# Patient Record
Sex: Male | Born: 1970 | Race: White | Hispanic: No | Marital: Married | Smoking: Never smoker
Health system: Southern US, Community
[De-identification: ages and names within clinical notes are randomized; demographics above are authoritative.]

---

## 2000-10-19 ENCOUNTER — Emergency Department (HOSPITAL_COMMUNITY): Admission: EM | Admit: 2000-10-19 | Discharge: 2000-10-19 | Payer: Self-pay | Admitting: Emergency Medicine

## 2006-06-17 ENCOUNTER — Encounter: Admission: RE | Admit: 2006-06-17 | Discharge: 2006-06-17 | Payer: Self-pay | Admitting: Otolaryngology

## 2006-11-08 ENCOUNTER — Encounter: Admission: RE | Admit: 2006-11-08 | Discharge: 2006-11-08 | Payer: Self-pay | Admitting: Internal Medicine

## 2007-10-13 ENCOUNTER — Encounter: Admission: RE | Admit: 2007-10-13 | Discharge: 2007-10-13 | Payer: Self-pay | Admitting: Internal Medicine

## 2008-02-23 ENCOUNTER — Encounter: Admission: RE | Admit: 2008-02-23 | Discharge: 2008-02-23 | Payer: Self-pay | Admitting: Gastroenterology

## 2008-03-28 ENCOUNTER — Ambulatory Visit: Payer: Self-pay | Admitting: Vascular Surgery

## 2008-03-28 ENCOUNTER — Ambulatory Visit (HOSPITAL_COMMUNITY): Admission: RE | Admit: 2008-03-28 | Discharge: 2008-03-28 | Payer: Self-pay | Admitting: Hematology & Oncology

## 2008-07-16 ENCOUNTER — Encounter: Admission: RE | Admit: 2008-07-16 | Discharge: 2008-07-16 | Payer: Self-pay | Admitting: Internal Medicine

## 2008-08-09 ENCOUNTER — Encounter: Admission: RE | Admit: 2008-08-09 | Discharge: 2008-08-09 | Payer: Self-pay | Admitting: Internal Medicine

## 2012-11-20 ENCOUNTER — Other Ambulatory Visit: Payer: Self-pay | Admitting: Internal Medicine

## 2012-11-20 DIAGNOSIS — N50819 Testicular pain, unspecified: Secondary | ICD-10-CM

## 2012-11-23 ENCOUNTER — Ambulatory Visit
Admission: RE | Admit: 2012-11-23 | Discharge: 2012-11-23 | Disposition: A | Discharge status: Home / Self Care | Payer: BC Managed Care – PPO | Source: Ambulatory Visit | Attending: Internal Medicine | Admitting: Internal Medicine

## 2012-11-23 DIAGNOSIS — N50819 Testicular pain, unspecified: Secondary | ICD-10-CM

## 2013-10-16 ENCOUNTER — Other Ambulatory Visit: Payer: Self-pay | Admitting: Internal Medicine

## 2013-10-16 DIAGNOSIS — R209 Unspecified disturbances of skin sensation: Secondary | ICD-10-CM

## 2013-10-19 ENCOUNTER — Ambulatory Visit
Admission: RE | Admit: 2013-10-19 | Discharge: 2013-10-19 | Disposition: A | Discharge status: Home / Self Care | Payer: BC Managed Care – PPO | Source: Ambulatory Visit | Attending: Internal Medicine | Admitting: Internal Medicine

## 2013-10-19 DIAGNOSIS — R209 Unspecified disturbances of skin sensation: Secondary | ICD-10-CM

## 2013-10-19 MED ORDER — GADOBENATE DIMEGLUMINE 529 MG/ML IV SOLN
16.0000 mL | Freq: Once | INTRAVENOUS | Status: AC | PRN
  Administered 2013-10-19: 16 mL via INTRAVENOUS

## 2013-11-02 ENCOUNTER — Encounter (HOSPITAL_COMMUNITY): Payer: Self-pay | Admitting: Emergency Medicine

## 2013-11-02 ENCOUNTER — Observation Stay (HOSPITAL_COMMUNITY): Payer: BC Managed Care – PPO | Admitting: Anesthesiology

## 2013-11-02 ENCOUNTER — Emergency Department (HOSPITAL_COMMUNITY): Payer: BC Managed Care – PPO

## 2013-11-02 ENCOUNTER — Observation Stay (HOSPITAL_COMMUNITY)
Admission: EM | Admit: 2013-11-02 | Discharge: 2013-11-04 | Disposition: A | Discharge status: Home / Self Care | Payer: BC Managed Care – PPO | Attending: Surgery | Admitting: Surgery

## 2013-11-02 ENCOUNTER — Encounter (HOSPITAL_COMMUNITY)
Admission: EM | Disposition: A | Discharge status: Home / Self Care | Payer: Self-pay | Source: Home / Self Care | Attending: Emergency Medicine

## 2013-11-02 ENCOUNTER — Encounter (HOSPITAL_COMMUNITY): Payer: BC Managed Care – PPO | Admitting: Anesthesiology

## 2013-11-02 DIAGNOSIS — K3533 Acute appendicitis with perforation and localized peritonitis, with abscess: Principal | ICD-10-CM | POA: Insufficient documentation

## 2013-11-02 DIAGNOSIS — K352 Acute appendicitis with generalized peritonitis, without abscess: Secondary | ICD-10-CM

## 2013-11-02 DIAGNOSIS — K3580 Unspecified acute appendicitis: Secondary | ICD-10-CM

## 2013-11-02 HISTORY — PX: LAPAROSCOPIC APPENDECTOMY: SHX408

## 2013-11-02 LAB — CBC WITH DIFFERENTIAL/PLATELET
BASOS ABS: 0 10*3/uL (ref 0.0–0.1)
BASOS PCT: 0 % (ref 0–1)
EOS ABS: 0 10*3/uL (ref 0.0–0.7)
EOS PCT: 0 % (ref 0–5)
HEMATOCRIT: 44.9 % (ref 39.0–52.0)
Hemoglobin: 16.3 g/dL (ref 13.0–17.0)
LYMPHS PCT: 8 % — AB (ref 12–46)
Lymphs Abs: 1 10*3/uL (ref 0.7–4.0)
MCH: 32.5 pg (ref 26.0–34.0)
MCHC: 36.3 g/dL — ABNORMAL HIGH (ref 30.0–36.0)
MCV: 89.4 fL (ref 78.0–100.0)
MONO ABS: 0.6 10*3/uL (ref 0.1–1.0)
Monocytes Relative: 5 % (ref 3–12)
Neutro Abs: 10.7 10*3/uL — ABNORMAL HIGH (ref 1.7–7.7)
Neutrophils Relative %: 87 % — ABNORMAL HIGH (ref 43–77)
Platelets: 162 10*3/uL (ref 150–400)
RBC: 5.02 MIL/uL (ref 4.22–5.81)
RDW: 11.5 % (ref 11.5–15.5)
WBC: 12.3 10*3/uL — ABNORMAL HIGH (ref 4.0–10.5)

## 2013-11-02 LAB — SURGICAL PCR SCREEN
MRSA, PCR: NEGATIVE
STAPHYLOCOCCUS AUREUS: POSITIVE — AB

## 2013-11-02 LAB — URINALYSIS W MICROSCOPIC + REFLEX CULTURE
Bilirubin Urine: NEGATIVE
Glucose, UA: NEGATIVE mg/dL
HGB URINE DIPSTICK: NEGATIVE
Leukocytes, UA: NEGATIVE
NITRITE: NEGATIVE
Protein, ur: NEGATIVE mg/dL
Specific Gravity, Urine: 1.025 (ref 1.005–1.030)
UROBILINOGEN UA: 0.2 mg/dL (ref 0.0–1.0)
pH: 6 (ref 5.0–8.0)

## 2013-11-02 LAB — POCT I-STAT, CHEM 8
BUN: 20 mg/dL (ref 6–23)
Calcium, Ion: 1.17 mmol/L (ref 1.12–1.23)
Chloride: 100 mEq/L (ref 96–112)
Creatinine, Ser: 1 mg/dL (ref 0.50–1.35)
Glucose, Bld: 120 mg/dL — ABNORMAL HIGH (ref 70–99)
HCT: 49 % (ref 39.0–52.0)
HEMOGLOBIN: 16.7 g/dL (ref 13.0–17.0)
Potassium: 3.7 mEq/L (ref 3.7–5.3)
Sodium: 140 mEq/L (ref 137–147)
TCO2: 27 mmol/L (ref 0–100)

## 2013-11-02 SURGERY — APPENDECTOMY, LAPAROSCOPIC
Anesthesia: General | Site: Abdomen

## 2013-11-02 MED ORDER — SODIUM CHLORIDE 0.9 % IR SOLN
Status: DC | PRN
  Administered 2013-11-02: 1000 mL

## 2013-11-02 MED ORDER — BUPIVACAINE-EPINEPHRINE (PF) 0.25% -1:200000 IJ SOLN
INTRAMUSCULAR | Status: AC
  Filled 2013-11-02: qty 30

## 2013-11-02 MED ORDER — MORPHINE SULFATE 2 MG/ML IJ SOLN: 1.0000 mg | INTRAMUSCULAR | Status: DC | PRN

## 2013-11-02 MED ORDER — SODIUM CHLORIDE 0.9 % IV SOLN
Freq: Once | INTRAVENOUS | Status: AC
  Administered 2013-11-02: 13:00:00 via INTRAVENOUS

## 2013-11-02 MED ORDER — IOHEXOL 300 MG/ML  SOLN
100.0000 mL | Freq: Once | INTRAMUSCULAR | Status: AC | PRN
  Administered 2013-11-02: 100 mL via INTRAVENOUS

## 2013-11-02 MED ORDER — ROCURONIUM BROMIDE 100 MG/10ML IV SOLN
INTRAVENOUS | Status: DC | PRN
  Administered 2013-11-02: 25 mg via INTRAVENOUS

## 2013-11-02 MED ORDER — PROPOFOL 10 MG/ML IV BOLUS
INTRAVENOUS | Status: DC | PRN
  Administered 2013-11-02: 200 mg via INTRAVENOUS

## 2013-11-02 MED ORDER — MORPHINE SULFATE 2 MG/ML IJ SOLN: 2.0000 mg | INTRAMUSCULAR | Status: DC | PRN

## 2013-11-02 MED ORDER — OXYCODONE HCL 5 MG PO TABS: 5.0000 mg | ORAL_TABLET | Freq: Once | ORAL | Status: DC | PRN

## 2013-11-02 MED ORDER — PIPERACILLIN-TAZOBACTAM 3.375 G IVPB
3.3750 g | Freq: Three times a day (TID) | INTRAVENOUS | Status: DC
  Administered 2013-11-02 – 2013-11-04 (×6): 3.375 g via INTRAVENOUS
  Filled 2013-11-02 (×12): qty 50

## 2013-11-02 MED ORDER — ONDANSETRON HCL 4 MG PO TABS: 4.0000 mg | ORAL_TABLET | Freq: Four times a day (QID) | ORAL | Status: DC | PRN

## 2013-11-02 MED ORDER — KCL IN DEXTROSE-NACL 20-5-0.45 MEQ/L-%-% IV SOLN
INTRAVENOUS | Status: DC
  Administered 2013-11-02 – 2013-11-03 (×2): via INTRAVENOUS
  Filled 2013-11-02 (×5): qty 1000

## 2013-11-02 MED ORDER — OXYCODONE HCL 5 MG/5ML PO SOLN: 5.0000 mg | Freq: Once | ORAL | Status: DC | PRN

## 2013-11-02 MED ORDER — ONDANSETRON HCL 4 MG/2ML IJ SOLN: 4.0000 mg | Freq: Four times a day (QID) | INTRAMUSCULAR | Status: DC | PRN

## 2013-11-02 MED ORDER — FENTANYL CITRATE 0.05 MG/ML IJ SOLN
INTRAMUSCULAR | Status: DC | PRN
  Administered 2013-11-02 (×3): 50 ug via INTRAVENOUS
  Administered 2013-11-02: 100 ug via INTRAVENOUS

## 2013-11-02 MED ORDER — ONDANSETRON HCL 4 MG/2ML IJ SOLN: 4.0000 mg | INTRAMUSCULAR | Status: DC | PRN

## 2013-11-02 MED ORDER — IOHEXOL 300 MG/ML  SOLN
25.0000 mL | Freq: Once | INTRAMUSCULAR | Status: AC | PRN
  Administered 2013-11-02: 25 mL via ORAL

## 2013-11-02 MED ORDER — BUPIVACAINE-EPINEPHRINE 0.25% -1:200000 IJ SOLN
INTRAMUSCULAR | Status: DC | PRN
  Administered 2013-11-02: 30 mL

## 2013-11-02 MED ORDER — HYDROMORPHONE HCL PF 1 MG/ML IJ SOLN
0.2500 mg | INTRAMUSCULAR | Status: DC | PRN
  Administered 2013-11-02: 1 mg via INTRAVENOUS

## 2013-11-02 MED ORDER — GLYCOPYRROLATE 0.2 MG/ML IJ SOLN
INTRAMUSCULAR | Status: DC | PRN
  Administered 2013-11-02: .6 mg via INTRAVENOUS

## 2013-11-02 MED ORDER — ACETAMINOPHEN 325 MG PO TABS: 650.0000 mg | ORAL_TABLET | ORAL | Status: DC | PRN

## 2013-11-02 MED ORDER — MIDAZOLAM HCL 5 MG/5ML IJ SOLN
INTRAMUSCULAR | Status: DC | PRN
  Administered 2013-11-02: 2 mg via INTRAVENOUS

## 2013-11-02 MED ORDER — LIDOCAINE HCL (CARDIAC) 20 MG/ML IV SOLN
INTRAVENOUS | Status: DC | PRN
  Administered 2013-11-02: 80 mg via INTRAVENOUS

## 2013-11-02 MED ORDER — OXYCODONE-ACETAMINOPHEN 5-325 MG PO TABS
1.0000 | ORAL_TABLET | ORAL | Status: DC | PRN
  Administered 2013-11-02 – 2013-11-03 (×4): 2 via ORAL
  Administered 2013-11-04 (×2): 1 via ORAL
  Filled 2013-11-02 (×3): qty 2
  Filled 2013-11-02: qty 1
  Filled 2013-11-02: qty 2
  Filled 2013-11-02: qty 1

## 2013-11-02 MED ORDER — NEOSTIGMINE METHYLSULFATE 1 MG/ML IJ SOLN
INTRAMUSCULAR | Status: DC | PRN
  Administered 2013-11-02: 4 mg via INTRAVENOUS

## 2013-11-02 MED ORDER — LACTATED RINGERS IV SOLN
INTRAVENOUS | Status: DC | PRN
  Administered 2013-11-02 (×2): via INTRAVENOUS

## 2013-11-02 MED ORDER — ONDANSETRON HCL 4 MG/2ML IJ SOLN
INTRAMUSCULAR | Status: DC | PRN
  Administered 2013-11-02: 4 mg via INTRAVENOUS

## 2013-11-02 MED ORDER — HYDROMORPHONE HCL PF 1 MG/ML IJ SOLN
INTRAMUSCULAR | Status: AC
  Filled 2013-11-02: qty 1

## 2013-11-02 MED ORDER — ENOXAPARIN SODIUM 40 MG/0.4ML ~~LOC~~ SOLN
40.0000 mg | SUBCUTANEOUS | Status: DC
  Administered 2013-11-03 – 2013-11-04 (×2): 40 mg via SUBCUTANEOUS
  Filled 2013-11-02 (×3): qty 0.4

## 2013-11-02 SURGICAL SUPPLY — 45 items
APL SKNCLS STERI-STRIP NONHPOA (GAUZE/BANDAGES/DRESSINGS) ×1
APPLIER CLIP ROT 10 11.4 M/L (STAPLE) ×3
APR CLP MED LRG 11.4X10 (STAPLE) ×1
BAG SPEC RTRVL LRG 6X4 10 (ENDOMECHANICALS) ×1
BENZOIN TINCTURE PRP APPL 2/3 (GAUZE/BANDAGES/DRESSINGS) ×3 IMPLANT
BLADE SURG ROTATE 9660 (MISCELLANEOUS) IMPLANT
CANISTER SUCTION 2500CC (MISCELLANEOUS) ×3 IMPLANT
CHLORAPREP W/TINT 26ML (MISCELLANEOUS) ×3 IMPLANT
CLIP APPLIE ROT 10 11.4 M/L (STAPLE) ×1 IMPLANT
CLOSURE WOUND 1/2 X4 (GAUZE/BANDAGES/DRESSINGS) ×1
COVER SURGICAL LIGHT HANDLE (MISCELLANEOUS) ×3 IMPLANT
CUTTER FLEX LINEAR 45M (STAPLE) ×3 IMPLANT
DECANTER SPIKE VIAL GLASS SM (MISCELLANEOUS) ×3 IMPLANT
DRAPE UTILITY 15X26 W/TAPE STR (DRAPE) ×6 IMPLANT
DRESSING OPSITE X SMALL 2X3 (GAUZE/BANDAGES/DRESSINGS) ×3 IMPLANT
DRSG OPSITE POSTOP 3X4 (GAUZE/BANDAGES/DRESSINGS) ×3 IMPLANT
ELECT REM PT RETURN 9FT ADLT (ELECTROSURGICAL) ×3
ELECTRODE REM PT RTRN 9FT ADLT (ELECTROSURGICAL) ×1 IMPLANT
ENDOLOOP SUT PDS II  0 18 (SUTURE)
ENDOLOOP SUT PDS II 0 18 (SUTURE) IMPLANT
FILTER SMOKE EVAC LAPAROSHD (FILTER) ×3 IMPLANT
GAUZE SPONGE 2X2 8PLY STRL LF (GAUZE/BANDAGES/DRESSINGS) ×1 IMPLANT
GLOVE BIO SURGEON STRL SZ7 (GLOVE) ×3 IMPLANT
GLOVE BIOGEL PI IND STRL 7.5 (GLOVE) ×1 IMPLANT
GLOVE BIOGEL PI INDICATOR 7.5 (GLOVE) ×2
GOWN STRL NON-REIN LRG LVL3 (GOWN DISPOSABLE) ×9 IMPLANT
KIT BASIN OR (CUSTOM PROCEDURE TRAY) ×3 IMPLANT
KIT ROOM TURNOVER OR (KITS) ×3 IMPLANT
NS IRRIG 1000ML POUR BTL (IV SOLUTION) ×3 IMPLANT
PAD ARMBOARD 7.5X6 YLW CONV (MISCELLANEOUS) ×6 IMPLANT
POUCH SPECIMEN RETRIEVAL 10MM (ENDOMECHANICALS) ×3 IMPLANT
RELOAD STAPLE TA45 3.5 REG BLU (ENDOMECHANICALS) ×3 IMPLANT
SCALPEL HARMONIC ACE (MISCELLANEOUS) ×3 IMPLANT
SET IRRIG TUBING LAPAROSCOPIC (IRRIGATION / IRRIGATOR) ×3 IMPLANT
SPECIMEN JAR SMALL (MISCELLANEOUS) ×3 IMPLANT
SPONGE GAUZE 2X2 STER 10/PKG (GAUZE/BANDAGES/DRESSINGS) ×2
STRIP CLOSURE SKIN 1/2X4 (GAUZE/BANDAGES/DRESSINGS) ×2 IMPLANT
SUT MNCRL AB 4-0 PS2 18 (SUTURE) ×3 IMPLANT
TOWEL OR 17X24 6PK STRL BLUE (TOWEL DISPOSABLE) ×3 IMPLANT
TOWEL OR 17X26 10 PK STRL BLUE (TOWEL DISPOSABLE) ×3 IMPLANT
TRAY FOLEY CATH 16FR SILVER (SET/KITS/TRAYS/PACK) IMPLANT
TRAY LAPAROSCOPIC (CUSTOM PROCEDURE TRAY) ×3 IMPLANT
TROCAR XCEL BLADELESS 5X75MML (TROCAR) ×6 IMPLANT
TROCAR XCEL BLUNT TIP 100MML (ENDOMECHANICALS) ×3 IMPLANT
WATER STERILE IRR 1000ML POUR (IV SOLUTION) IMPLANT

## 2013-11-02 NOTE — Anesthesia Postprocedure Evaluation (Signed)
Anesthesia Post Note  Patient: Walter Torres  Procedure(s) Performed: Procedure(s) (LRB): APPENDECTOMY LAPAROSCOPIC (N/A)  Anesthesia type: General  Patient location: PACU  Post pain: Pain level controlled  Post assessment: Post-op Vital signs reviewed  Last Vitals: BP 120/65  Pulse 85  Temp(Src) 36.8 C (Oral)  Resp 17  Ht 6' (1.829 m)  Wt 175 lb (79.379 kg)  BMI 23.73 kg/m2  SpO2 100%  Post vital signs: Reviewed  Level of consciousness: sedated  Complications: No apparent anesthesia complications

## 2013-11-02 NOTE — H&P (Signed)
Walter SpragueLouis M Torres 05/01/1971  [id number].   Chief Complaint/Reason for Consult: acute appendicitis HPI: This is a 43 yo otherwise healthy white male who began having some vague mid abdominal pain on Monday.  He denies nausea or vomiting until last night.  His pain progressed to the RLQ over the last day or so.  He thought he had constipation and so took some milk of magnesia and then had diarrhea.  Denies fevers.  Came to the [facility] today due to persistent pain.  He was found to have acute appendicitis on CT scan.  We have been asked to see for admission.  ROS: Please see HPI, otherwise all other systems are negative  History reviewed. No pertinent family history.  History reviewed. No pertinent past medical history.  History reviewed. No pertinent past surgical history.  Social History:  reports that he has never smoked. He does not have any smokeless tobacco history on file. He reports that he does not drink alcohol or use illicit drugs.  Allergies:  Allergies  Allergen Reactions  . Neosporin [Neomycin-Polymyxin-Gramicidin] Rash    Area progressively gets worse.      (Not in a hospital admission)  Blood pressure 135/88, pulse 99, temperature 98.7 F (37.1 C), temperature source Oral, resp. rate 14, SpO2 97.00%. Physical Exam: General: pleasant, WD, WN white male who is laying in bed in NAD HEENT: head is normocephalic, atraumatic.  Sclera are noninjected.  PERRL.  Ears and nose without any masses or lesions.  Mouth is pink and moist Heart: regular, rate, and rhythm.  Normal s1,s2. No obvious murmurs, gallops, or rubs noted.  Palpable radial and pedal pulses bilaterally Lungs: CTAB, no wheezes, rhonchi, or rales noted.  Respiratory effort nonlabored Abd: soft, tender in RLQ over McBurney's point, ND, +BS, no masses, hernias, or organomegaly MS: all 4 extremities are symmetrical with no cyanosis, clubbing, or edema. Skin: warm and dry with no masses, lesions, or rashes Psych: A&Ox3 with  an appropriate affect.    Results for orders placed during the hospital encounter of 11/02/13 (from the past 48 hour(s))  CBC WITH DIFFERENTIAL     Status: Abnormal   Collection Time    11/02/13 12:25 PM      Result Value Range   WBC 12.3 (*) 4.0 - 10.5 K/uL   RBC 5.02  4.22 - 5.81 MIL/uL   Hemoglobin 16.3  13.0 - 17.0 g/dL   HCT 53.644.9  64.439.0 - 03.452.0 %   MCV 89.4  78.0 - 100.0 fL   MCH 32.5  26.0 - 34.0 pg   MCHC 36.3 (*) 30.0 - 36.0 g/dL   RDW 74.211.5  59.511.5 - 63.815.5 %   Platelets 162  150 - 400 K/uL   Neutrophils Relative % 87 (*) 43 - 77 %   Neutro Abs 10.7 (*) 1.7 - 7.7 K/uL   Lymphocytes Relative 8 (*) 12 - 46 %   Lymphs Abs 1.0  0.7 - 4.0 K/uL   Monocytes Relative 5  3 - 12 %   Monocytes Absolute 0.6  0.1 - 1.0 K/uL   Eosinophils Relative 0  0 - 5 %   Eosinophils Absolute 0.0  0.0 - 0.7 K/uL   Basophils Relative 0  0 - 1 %   Basophils Absolute 0.0  0.0 - 0.1 K/uL  POCT I-STAT, CHEM 8     Status: Abnormal   Collection Time    11/02/13 12:32 PM      Result Value Range   Sodium 140  137 - 147 mEq/L   Potassium 3.7  3.7 - 5.3 mEq/L   Chloride 100  96 - 112 mEq/L   BUN 20  6 - 23 mg/dL   Creatinine, Ser 1.61  0.50 - 1.35 mg/dL   Glucose, Bld 096 (*) 70 - 99 mg/dL   Calcium, Ion 0.45  4.09 - 1.23 mmol/L   TCO2 27  0 - 100 mmol/L   Hemoglobin 16.7  13.0 - 17.0 g/dL   HCT 81.1  91.4 - 78.2 %  URINALYSIS W MICROSCOPIC + REFLEX CULTURE     Status: Abnormal   Collection Time    11/02/13 12:36 PM      Result Value Range   Color, Urine YELLOW  YELLOW   APPearance CLEAR  CLEAR   Specific Gravity, Urine 1.025  1.005 - 1.030   pH 6.0  5.0 - 8.0   Glucose, UA NEGATIVE  NEGATIVE mg/dL   Hgb urine dipstick NEGATIVE  NEGATIVE   Bilirubin Urine NEGATIVE  NEGATIVE   Ketones, ur >80 (*) NEGATIVE mg/dL   Protein, ur NEGATIVE  NEGATIVE mg/dL   Urobilinogen, UA 0.2  0.0 - 1.0 mg/dL   Nitrite NEGATIVE  NEGATIVE   Leukocytes, UA NEGATIVE  NEGATIVE   WBC, UA 0-2  <3 WBC/hpf   Squamous  Epithelial / LPF RARE  RARE   Ct Abdomen Pelvis W Contrast  11/02/2013   CLINICAL DATA:  Right lower quadrant pain of 5 days duration.  EXAM: CT ABDOMEN AND PELVIS WITH CONTRAST  TECHNIQUE: Multidetector CT imaging of the abdomen and pelvis was performed using the standard protocol following bolus administration of intravenous contrast.  CONTRAST:  OMNIPAQUE IOHEXOL 300 MG/ML  SOLN  COMPARISON:  02/23/2008  FINDINGS: Lung bases are clear.  No pleural or pericardial fluid.  The liver has a normal appearance without focal lesions or biliary ductal dilatation. No calcified gallstones. The spleen is normal. The pancreas is normal. The adrenal glands are normal. The kidneys are normal. The aorta and IVC are normal.  There is acute appendicitis with an appendicolith. The appendix is dilated and there is periappendiceal stranding. No evidence of frank perforation at this moment.  Bladder, prostate gland and seminal vesicles appear normal.  IMPRESSION: Acute appendicitis. Dilated appendix with appendicolith. Surrounding soft tissue stranding. No frank perforation.   Electronically Signed   By: Paulina Fusi M.D.   On: 11/02/2013 13:20       Assessment/Plan 1. Acute appendicitis  Plan: 1. Admit for OR tonight for lap appy.  The procedure along with risks, complications, and expected outcomes have been explained to the patient and his wife.  They understand and are agreeable to proceed.  He will be NPO and given IV abx.  Callia Swim E 11/02/2013, 2:36 PM Pager: [phone]

## 2013-11-02 NOTE — ED Notes (Signed)
Pt reports RLQ pain x 5 days with pain on movement and palpation. Pts PCP sent here to rule out appendicitis. Pt has N/V associated. Reports pain 2/10.

## 2013-11-02 NOTE — Anesthesia Preprocedure Evaluation (Signed)
Anesthesia Evaluation  Patient identified by MRN, date of birth, ID band Patient awake    Reviewed: Allergy & Precautions, H&P , NPO status , Patient's Chart, lab work & pertinent test results  Airway Mallampati: II  Neck ROM: full    Dental   Pulmonary neg pulmonary ROS,          Cardiovascular negative cardio ROS      Neuro/Psych Anxiety    GI/Hepatic   Endo/Other    Renal/GU      Musculoskeletal   Abdominal   Peds  Hematology   Anesthesia Other Findings   Reproductive/Obstetrics                           Anesthesia Physical Anesthesia Plan  ASA: I  Anesthesia Plan: General   Post-op Pain Management:    Induction: Intravenous  Airway Management Planned: Oral ETT  Additional Equipment:   Intra-op Plan:   Post-operative Plan: Extubation in OR  Informed Consent: I have reviewed the patients History and Physical, chart, labs and discussed the procedure including the risks, benefits and alternatives for the proposed anesthesia with the patient or authorized representative who has indicated his/her understanding and acceptance.     Plan Discussed with: CRNA, Anesthesiologist and Surgeon  Anesthesia Plan Comments:         Anesthesia Quick Evaluation

## 2013-11-02 NOTE — ED Notes (Signed)
PA at bedside.

## 2013-11-02 NOTE — ED Provider Notes (Signed)
CSN: [id number]     Arrival date & time 11/02/13  1124 History   First MD Initiated Contact with Patient 11/02/13 1134     Chief Complaint  Patient presents with  . Abdominal Pain   (Consider location/radiation/quality/duration/timing/severity/associated sxs/prior Treatment) HPI Walter Torres is a 43 y.o. male who presents to emergency department complaining of abdominal. Patient states that his pain began 4 days ago. Pain is constant. Pain is in the right lower quadrant. Patient states his pain is worsened with coughing, walking, movement. States that has had to sleep on the cough the last few days. Reports taking mag citrate, with diarrhea over night last night. States also began having nausea and vomiting yesterday. Denies fever. Admits to chills. No hx of abd surgeries. No urinary symptoms. No blood in stool. Took tylenol and excedrin for pain with mild relief.    History reviewed. No pertinent past medical history. History reviewed. No pertinent past surgical history. History reviewed. No pertinent family history. History  Substance Use Topics  . Smoking status: Never Smoker   . Smokeless tobacco: Not on file  . Alcohol Use: No    Review of Systems  Constitutional: Negative for fever and chills.  Respiratory: Negative for cough, chest tightness and shortness of breath.   Cardiovascular: Negative for chest pain, palpitations and leg swelling.  Gastrointestinal: Positive for nausea, vomiting and abdominal pain. Negative for diarrhea and abdominal distention.  Genitourinary: Negative for dysuria, urgency, frequency and hematuria.  Musculoskeletal: Negative for arthralgias, myalgias, neck pain and neck stiffness.  Skin: Negative for rash.  Allergic/Immunologic: Negative for immunocompromised state.  Neurological: Negative for dizziness, weakness, light-headedness, numbness and headaches.    Allergies  Neosporin  Home Medications   Current Outpatient Rx  Name  Route  Sig   Dispense  Refill  . acetaminophen (TYLENOL) 500 MG tablet   Oral   Take 1,000 mg by mouth every 6 (six) hours as needed for headache.         . Aspirin-Acetaminophen-Caffeine (EXCEDRIN PO)   Oral   Take 1 tablet by mouth daily as needed (headache).         . clonazePAM (KLONOPIN) 0.5 MG tablet   Oral   Take 0.5 mg by mouth daily.         . magnesium hydroxide (MILK OF MAGNESIA) 400 MG/5ML suspension   Oral   Take 60 mLs by mouth daily as needed for mild constipation.         . Probiotic Product (PROBIOTIC PO)   Oral   Take 1 tablet by mouth daily. [employer] brand          BP 131/77  Pulse 107  Temp(Src) 97.7 F (36.5 C) (Oral)  Resp 20  SpO2 98% Physical Exam  Nursing note and vitals reviewed. Constitutional: He appears well-developed and well-nourished. No distress.  HENT:  Head: Normocephalic and atraumatic.  Eyes: Conjunctivae are normal.  Neck: Neck supple.  Cardiovascular: Normal rate, regular rhythm and normal heart sounds.   Pulmonary/Chest: Effort normal. No respiratory distress. He has no wheezes. He has no rales.  Abdominal: Soft. Bowel sounds are normal. He exhibits no distension. There is tenderness. There is no rebound and no guarding.  RLQ tenderness.  Musculoskeletal: He exhibits no edema.  Neurological: He is alert.  Skin: Skin is warm and dry.    ED Course  Procedures (including critical care time) Labs Review Labs Reviewed  SURGICAL PCR SCREEN - Abnormal; Notable for the following:  Staphylococcus aureus POSITIVE (*)    All other components within normal limits  CBC WITH DIFFERENTIAL - Abnormal; Notable for the following:    WBC 12.3 (*)    MCHC 36.3 (*)    Neutrophils Relative % 87 (*)    Neutro Abs 10.7 (*)    Lymphocytes Relative 8 (*)    All other components within normal limits  URINALYSIS W MICROSCOPIC + REFLEX CULTURE - Abnormal; Notable for the following:    Ketones, ur >80 (*)    All other components within normal limits   POCT I-STAT, CHEM 8 - Abnormal; Notable for the following:    Glucose, Bld 120 (*)    All other components within normal limits  BASIC METABOLIC PANEL  CBC   Imaging Review Ct Abdomen Pelvis W Contrast  11/02/2013   CLINICAL DATA:  Right lower quadrant pain of 5 days duration.  EXAM: CT ABDOMEN AND PELVIS WITH CONTRAST  TECHNIQUE: Multidetector CT imaging of the abdomen and pelvis was performed using the standard protocol following bolus administration of intravenous contrast.  CONTRAST:  OMNIPAQUE IOHEXOL 300 MG/ML  SOLN  COMPARISON:  02/23/2008  FINDINGS: Lung bases are clear.  No pleural or pericardial fluid.  The liver has a normal appearance without focal lesions or biliary ductal dilatation. No calcified gallstones. The spleen is normal. The pancreas is normal. The adrenal glands are normal. The kidneys are normal. The aorta and IVC are normal.  There is acute appendicitis with an appendicolith. The appendix is dilated and there is periappendiceal stranding. No evidence of frank perforation at this moment.  Bladder, prostate gland and seminal vesicles appear normal.  IMPRESSION: Acute appendicitis. Dilated appendix with appendicolith. Surrounding soft tissue stranding. No frank perforation.   Electronically Signed   By: Paulina Fusi M.D.   On: 11/02/2013 13:20    EKG Interpretation   None       MDM   1. Acute appendicitis     Patient with right lower quadrant pain. We'll get labs and CT abdomen and pelvis.  Patient's CT scan is positive for acute appendicitis. General surgery was consult and will take to surgery. Patient is informed of the diagnosis.   Filed Vitals:   11/02/13 1945 11/02/13 2000 11/02/13 2009 11/02/13 2026  BP:   118/62 120/65  Pulse: 80 78  85  Temp:   98.3 F (36.8 C) 98.3 F (36.8 C)  TempSrc:    Oral  Resp: 16 14 17 17   Height:      Weight:      SpO2: 92% 94%  100%       Lottie Mussel, PA-C 11/02/13 2029

## 2013-11-02 NOTE — ED Notes (Signed)
Ct notified that pt finished oral contrast.

## 2013-11-02 NOTE — Transfer of Care (Signed)
Immediate Anesthesia Transfer of Care Note  Patient: Walter Torres  Procedure(s) Performed: Procedure(s): APPENDECTOMY LAPAROSCOPIC (N/A)  Patient Location: PACU  Anesthesia Type:General  Level of Consciousness: awake, alert  and oriented  Airway & Oxygen Therapy: Patient Spontanous Breathing  Post-op Assessment: Report given to PACU RN  Post vital signs: Reviewed and stable  Complications: No apparent anesthesia complications

## 2013-11-02 NOTE — ED Provider Notes (Signed)
Medical screening examination/treatment/procedure(s) were performed by non-physician practitioner and as supervising physician I was immediately available for consultation/collaboration.  EKG Interpretation    Date/Time:    Ventricular Rate:    PR Interval:    QRS Duration:   QT Interval:    QTC Calculation:   R Axis:     Text Interpretation:                Gwyneth SproutWhitney Yao [place], MD 11/02/13 2154

## 2013-11-02 NOTE — H&P (Signed)
Agree with above. Will proceed with laparoscopic appendectomy tonight.  Wilmon ArmsMatthew K. Corliss Skainssuei, MD, [facility]  General/ Trauma Surgery  11/02/2013 3:49 PM

## 2013-11-02 NOTE — Op Note (Signed)
Appendectomy, Lap, Procedure Note  Indications: The patient presented with a history of right-sided abdominal pain for the last four days. A CT scan  revealed findings consistent with acute appendicitis but no obvious perforation.  Pre-operative Diagnosis: Acute appendicitis without mention of peritonitis  Post-operative Diagnosis: Acute perforated appendicitis with peritoneal abscess  Surgeon: Wynona LunaSUEI,Azarah Dacy K.   Assistants: none  Anesthesia: General endotracheal anesthesia  ASA Class: 1E  Procedure Details  The patient was seen again in the Holding Room. The risks, benefits, complications, treatment options, and expected outcomes were discussed with the patient and/or family. The possibilities of reaction to medication, perforation of viscus, bleeding, recurrent infection, finding a normal appendix, the need for additional procedures, failure to diagnose a condition, and creating a complication requiring transfusion or operation were discussed. There was concurrence with the proposed plan and informed consent was obtained. The site of surgery was properly noted. The patient was taken to Operating Room, identified as Walter Torres and the procedure verified as Appendectomy. A Time Out was held and the above information confirmed.  The patient was placed in the supine position and general anesthesia was induced.  The abdomen was prepped and draped in a sterile fashion. A one centimeter infraumbilical incision was made.  Dissection was carried down to the fascia bluntly.  The fascia was incised vertically.  We entered the peritoneal cavity bluntly.  A pursestring suture was passed around the incision with a 0 Vicryl.  The Hasson cannula was introduced into the abdomen and the tails of the suture were used to hold the Hasson in place.   The pneumoperitoneum was then established maintaining a maximum pressure of 15 mmHg.  Additional 5 mm cannulas then placed in the left lower quadrant of the abdomen  and the right upper quadrant under direct visualization. A careful evaluation of the entire abdomen was carried out. The patient was placed in Trendelenburg and left lateral decubitus position.  The scope was moved to the right upper quadrant port site. The omentum is densely adherent to a inflammatory mass in the right lower quadrant.  The omentum was bluntly dissected away from the mass.  The appendix was identified and was quite inflamed and swollen.  There were some areas of patchy necrosis.  As I lifted the appendix, a moderate amount of purulent fluid was encountered, indicative of an appendiceal abscess, likely from perforation.  The appendix was carefully dissected. The appendix was was skeletonized with the harmonic scalpel.  The appendiceal artery was ligated with a clip. The appendix was divided at its base using an endo-GIA stapler. Minimal appendiceal stump was left in place. There was no evidence of bleeding, leakage, or complication after division of the appendix. Irrigation was also performed and irrigate suctioned from the abdomen as well.  The umbilical port site was closed with the purse string suture. There was no residual palpable fascial defect.  The trocar site skin wounds were closed with 4-0 Monocryl.  Instrument, sponge, and needle counts were correct at the conclusion of the case.   Findings: The appendix was found to be inflamed. There were signs of necrosis.  There was perforation. There was abscess formation.  Estimated Blood Loss:  less than 100 mL         Drains: none         Specimens: appendix         Complications:  None; patient tolerated the procedure well.         Disposition: PACU - hemodynamically  stable.         Condition: stable  Walter Torres. Walter Skains, MD, [facility]  General/ Trauma Surgery  11/02/2013 7:10 PM

## 2013-11-02 NOTE — OR Nursing (Signed)
Patient voided just before entering operating room per Dr Corliss Skainssuei request

## 2013-11-02 NOTE — ED Notes (Signed)
Pt c/o RLQ pain x 5 days with N/V

## 2013-11-03 LAB — BASIC METABOLIC PANEL
BUN: 13 mg/dL (ref 6–23)
CO2: 29 meq/L (ref 19–32)
Calcium: 8.4 mg/dL (ref 8.4–10.5)
Chloride: 103 mEq/L (ref 96–112)
Creatinine, Ser: 1.08 mg/dL (ref 0.50–1.35)
GFR calc Af Amer: >90 mL/min (ref >90)
GFR calc non Af Amer: 82 mL/min — ABNORMAL LOW (ref >90)
GLUCOSE: 123 mg/dL — AB (ref 70–99)
POTASSIUM: 4.2 meq/L (ref 3.7–5.3)
SODIUM: 141 meq/L (ref 137–147)

## 2013-11-03 LAB — CBC
HEMATOCRIT: 38.9 % — AB (ref 39.0–52.0)
HEMOGLOBIN: 13.3 g/dL (ref 13.0–17.0)
MCH: 31.3 pg (ref 26.0–34.0)
MCHC: 34.2 g/dL (ref 30.0–36.0)
MCV: 91.5 fL (ref 78.0–100.0)
Platelets: 141 10*3/uL — ABNORMAL LOW (ref 150–400)
RBC: 4.25 MIL/uL (ref 4.22–5.81)
RDW: 11.8 % (ref 11.5–15.5)
WBC: 15.4 10*3/uL — ABNORMAL HIGH (ref 4.0–10.5)

## 2013-11-03 MED ORDER — MUPIROCIN 2 % EX OINT
1.0000 "application " | TOPICAL_OINTMENT | Freq: Two times a day (BID) | CUTANEOUS | Status: DC
  Administered 2013-11-03 (×2): 1 via NASAL
  Filled 2013-11-03: qty 22

## 2013-11-03 MED ORDER — CHLORHEXIDINE GLUCONATE CLOTH 2 % EX PADS
6.0000 | MEDICATED_PAD | Freq: Every day | CUTANEOUS | Status: DC
  Administered 2013-11-03: 6 via TOPICAL

## 2013-11-03 NOTE — Progress Notes (Signed)
1 Day Post-Op  Subjective: Stable and alert. Says he still has pain but it is improved. No nausea or vomiting. Tolerated clear liquids advancing to regular diet this morning.  Able to void, slow to initiate stream but empties well. Ambulates to bathroom.  Lab work this morning reveals WBC 15,400, hemoglobin 13.3. Normal electrolytes and renal function.  Objective: Vital signs in last 24 hours: Temp:  [97.7 F (36.5 C)-99.4 F (37.4 C)] 98.3 F (36.8 C) (01/03 0557) Pulse Rate:  [77-107] 88 (01/03 0557) Resp:  [14-20] 16 (01/03 0557) BP: (104-135)/(57-88) 104/59 mmHg (01/03 0557) SpO2:  [92 %-100 %] 100 % (01/03 0557) Weight:  [175 lb (79.379 kg)] 175 lb (79.379 kg) (01/02 1524) Last BM Date: 11/01/13  Intake/Output from previous day: 01/02 0701 - 01/03 0700 In: 1790 [P.O.:240; I.V.:1550] Out: 1000 [Urine:1000] Intake/Output this shift:    General appearance: alert. Mental status normal. In no distress. GI: abdomen is generally soft. Not distended.  Rare bowel sounds. Wounds looked fine. Tender right lower quadrant.  Lab Results:  Results for orders placed during the hospital encounter of 11/02/13 (from the past 24 hour(s))  CBC WITH DIFFERENTIAL     Status: Abnormal   Collection Time    11/02/13 12:25 PM      Result Value Range   WBC 12.3 (*) 4.0 - 10.5 K/uL   RBC 5.02  4.22 - 5.81 MIL/uL   Hemoglobin 16.3  13.0 - 17.0 g/dL   HCT 40.9  81.1 - 91.4 %   MCV 89.4  78.0 - 100.0 fL   MCH 32.5  26.0 - 34.0 pg   MCHC 36.3 (*) 30.0 - 36.0 g/dL   RDW 78.2  95.6 - 21.3 %   Platelets 162  150 - 400 K/uL   Neutrophils Relative % 87 (*) 43 - 77 %   Neutro Abs 10.7 (*) 1.7 - 7.7 K/uL   Lymphocytes Relative 8 (*) 12 - 46 %   Lymphs Abs 1.0  0.7 - 4.0 K/uL   Monocytes Relative 5  3 - 12 %   Monocytes Absolute 0.6  0.1 - 1.0 K/uL   Eosinophils Relative 0  0 - 5 %   Eosinophils Absolute 0.0  0.0 - 0.7 K/uL   Basophils Relative 0  0 - 1 %   Basophils Absolute 0.0  0.0 - 0.1 K/uL   POCT I-STAT, CHEM 8     Status: Abnormal   Collection Time    11/02/13 12:32 PM      Result Value Range   Sodium 140  137 - 147 mEq/L   Potassium 3.7  3.7 - 5.3 mEq/L   Chloride 100  96 - 112 mEq/L   BUN 20  6 - 23 mg/dL   Creatinine, Ser 0.86  0.50 - 1.35 mg/dL   Glucose, Bld 578 (*) 70 - 99 mg/dL   Calcium, Ion 4.69  6.29 - 1.23 mmol/L   TCO2 27  0 - 100 mmol/L   Hemoglobin 16.7  13.0 - 17.0 g/dL   HCT 52.8  41.3 - 24.4 %  URINALYSIS W MICROSCOPIC + REFLEX CULTURE     Status: Abnormal   Collection Time    11/02/13 12:36 PM      Result Value Range   Color, Urine YELLOW  YELLOW   APPearance CLEAR  CLEAR   Specific Gravity, Urine 1.025  1.005 - 1.030   pH 6.0  5.0 - 8.0   Glucose, UA NEGATIVE  NEGATIVE mg/dL  Hgb urine dipstick NEGATIVE  NEGATIVE   Bilirubin Urine NEGATIVE  NEGATIVE   Ketones, ur >80 (*) NEGATIVE mg/dL   Protein, ur NEGATIVE  NEGATIVE mg/dL   Urobilinogen, UA 0.2  0.0 - 1.0 mg/dL   Nitrite NEGATIVE  NEGATIVE   Leukocytes, UA NEGATIVE  NEGATIVE   WBC, UA 0-2  <3 WBC/hpf   Squamous Epithelial / LPF RARE  RARE  SURGICAL PCR SCREEN     Status: Abnormal   Collection Time    11/02/13  3:37 PM      Result Value Range   MRSA, PCR NEGATIVE  NEGATIVE   Staphylococcus aureus POSITIVE (*) NEGATIVE  BASIC METABOLIC PANEL     Status: Abnormal   Collection Time    11/03/13  5:22 AM      Result Value Range   Sodium 141  137 - 147 mEq/L   Potassium 4.2  3.7 - 5.3 mEq/L   Chloride 103  96 - 112 mEq/L   CO2 29  19 - 32 mEq/L   Glucose, Bld 123 (*) 70 - 99 mg/dL   BUN 13  6 - 23 mg/dL   Creatinine, Ser 1.611.08  0.50 - 1.35 mg/dL   Calcium 8.4  8.4 - 09.610.5 mg/dL   GFR calc non Af Amer 82 (*) >90 mL/min   GFR calc Af Amer >90  >90 mL/min  CBC     Status: Abnormal   Collection Time    11/03/13  5:22 AM      Result Value Range   WBC 15.4 (*) 4.0 - 10.5 K/uL   RBC 4.25  4.22 - 5.81 MIL/uL   Hemoglobin 13.3  13.0 - 17.0 g/dL   HCT 04.538.9 (*) 40.939.0 - 81.152.0 %   MCV 91.5   78.0 - 100.0 fL   MCH 31.3  26.0 - 34.0 pg   MCHC 34.2  30.0 - 36.0 g/dL   RDW 91.411.8  78.211.5 - 95.615.5 %   Platelets 141 (*) 150 - 400 K/uL     Studies/Results: @[id number] @  . Chlorhexidine Gluconate Cloth  6 each Topical Daily  . enoxaparin (LOVENOX) injection  40 mg Subcutaneous Q24H  . mupirocin ointment  1 application Nasal BID  . piperacillin-tazobactam (ZOSYN)  IV  3.375 g Intravenous Q8H     Assessment/Plan: s/p Procedure(s): APPENDECTOMY LAPAROSCOPIC  POD #1. Laparoscopic appendectomy for ruptured appendicitis with localized peritonitis. Stable Advance diet and activities Continue intravenous antibiotics mobilize Possible discharge tomorrow on oral antibiotics if doing well.  @PROBHOSP @  LOS: 1 day    Bethene Hankinson M 11/03/2013  . .prob

## 2013-11-03 NOTE — Progress Notes (Signed)
Utilization Review completed.  

## 2013-11-04 MED ORDER — HYDROCODONE-ACETAMINOPHEN 5-325 MG PO TABS: 1.0000 | ORAL_TABLET | ORAL | Status: AC | PRN

## 2013-11-04 MED ORDER — AMOXICILLIN-POT CLAVULANATE 875-125 MG PO TABS: 1.0000 | ORAL_TABLET | Freq: Two times a day (BID) | ORAL | Status: AC

## 2013-11-04 NOTE — Discharge Instructions (Signed)
See above

## 2013-11-04 NOTE — Discharge Summary (Signed)
  Patient ID: Walter Torres [id number] 43 y.o. 07/24/1971  Admit date: 11/02/2013  Discharge date and time: No discharge date for patient encounter.  Admitting Physician: Ernestene MentionINGRAM,Damary Doland M  Discharge Physician: Ernestene MentionINGRAM,Kanesha Cadle M  Admission Diagnoses: Acute appendicitis [540.9]  Discharge Diagnoses: Perforated appendicitis with localized peritonitis  Operations: Procedure(s): APPENDECTOMY LAPAROSCOPIC  Admission Condition: fair  Discharged Condition: good  Indication for Admission: This is a 43 yo otherwise healthy white male who began having some vague mid abdominal pain on Monday. He denies nausea or vomiting until last night. His pain progressed to the RLQ over the last day or so. He thought he had constipation and so took some milk of magnesia and then had diarrhea. Denies fevers. Came to the [facility] today due to persistent pain. He was found to have acute appendicitis on CT scan. Surgical consultation was requested and he was admitted for further management and appendectomy.   Hospital Course: The patient was started on IV fluids, antibiotics, and was taken to the operating room.The patient was found to have perforated appendicitis which was walled off by the omentum. There was some localized peritonitis. Appendectomy was performed. Postoperatively the patient did well. He was continued on broad-spectrum antibiotics. He advanced his diet and activities well. He became ambulatory independently, voiding intermittently. He tolerated regular diet for several meals and had 2 bowel movements. He was discharge on postoperative day #2. At that time he looked very good. He is still having some incisional pain but nothing out of the ordinary. He had low-grade fever of 100.4 did not look ill in any way. He was given a prescription for Augmentin for 7 days and hydrocodone. He was advised to call Walter Torres if there was any fever or increasing pain. He is told to return to see Dr. Corliss Skainssuei in the office in 2  weeks.  Consults: None  Significant Diagnostic Studies: Laboratory testing. CT scan. Surgical pathology, pending  Treatments: surgery: Laparoscopic appendectomy  Disposition: Home  Patient Instructions:    Medication List         acetaminophen 500 MG tablet  Commonly known as:  TYLENOL  Take 1,000 mg by mouth every 6 (six) hours as needed for headache.     amoxicillin-clavulanate 875-125 MG per tablet  Commonly known as:  AUGMENTIN  Take 1 tablet by mouth 2 (two) times daily.     clonazePAM 0.5 MG tablet  Commonly known as:  KLONOPIN  Take 0.5 mg by mouth daily.     EXCEDRIN PO  Take 1 tablet by mouth daily as needed (headache).     HYDROcodone-acetaminophen 5-325 MG per tablet  Commonly known as:  NORCO/VICODIN  Take 1-2 tablets by mouth every 4 (four) hours as needed.     magnesium hydroxide 400 MG/5ML suspension  Commonly known as:  MILK OF MAGNESIA  Take 60 mLs by mouth daily as needed for mild constipation.     PROBIOTIC PO  Take 1 tablet by mouth daily. [facility] brand        Activity: activity as tolerated Diet: low fat, low cholesterol diet Wound Care: none needed  Follow-up:  With Dr. Corliss Skainssuei in 2 weeks.  Signed: Angelia MouldHaywood M. Derrell LollingIngram, M.D., FACS General and minimally invasive surgery Breast and Colorectal Surgery  11/04/2013, 8:45 AM

## 2013-11-07 ENCOUNTER — Encounter (HOSPITAL_COMMUNITY): Payer: Self-pay | Admitting: Surgery

## 2013-11-16 ENCOUNTER — Telehealth (INDEPENDENT_AMBULATORY_CARE_PROVIDER_SITE_OTHER): Payer: Self-pay | Admitting: General Surgery

## 2013-11-16 NOTE — Telephone Encounter (Signed)
Pt's wife called to report pt has had diarrhea x 3 days, now clear fluid expelled.  Low-grade fever (highest 100.3 F.)  Had ruptured appendix and surgery on 11/02/13 with postop appt with [facility] on 11/26/13.  Concerned about the diarrhea now.  Reassured pt/ wife this sounds more like a GI virus at this point.  No new pain, no swelling---only tenderness related to frequency of diarrhea.  Recommended Gatorade to improve hydration and replace electrolytes.  Avoid dairy and dairy products.  Try to limit to clear liquids until diarrhea resolves, then slowly reintroduce solids beginning with carbohydrates.  They understand to call back for temperature > 101. F, increased pain or other new symptoms.  Virus will run its course.

## 2013-11-26 ENCOUNTER — Encounter (INDEPENDENT_AMBULATORY_CARE_PROVIDER_SITE_OTHER): Payer: BC Managed Care – PPO | Admitting: Surgery

## 2013-11-30 ENCOUNTER — Encounter (INDEPENDENT_AMBULATORY_CARE_PROVIDER_SITE_OTHER): Payer: BC Managed Care – PPO | Admitting: Surgery

## 2014-10-03 ENCOUNTER — Other Ambulatory Visit: Payer: Self-pay | Admitting: Gastroenterology

## 2014-11-11 IMAGING — CT CT ABD-PELV W/ CM
2 of 5 series · 16 of 46 positions shown, 18 images · IV contrast (omnipaque)
Comparison: 02/23/2008

CLINICAL DATA: Right lower quadrant pain of 5 days duration.

EXAM:
CT ABDOMEN AND PELVIS WITH CONTRAST
TECHNIQUE: Multidetector CT imaging of the abdomen and pelvis was performed
using the standard protocol following bolus administration of
intravenous contrast.
CONTRAST:  100mL OMNIPAQUE IOHEXOL 300 MG/ML  SOLN

[Series 2: abd/ pelvis 5.0 i30f 1 · axial · 0.70mm/px · z∈[-508,-108]mm · 13 of 90 slices shown, 15 images]
[im 5/90  soft-tissue]
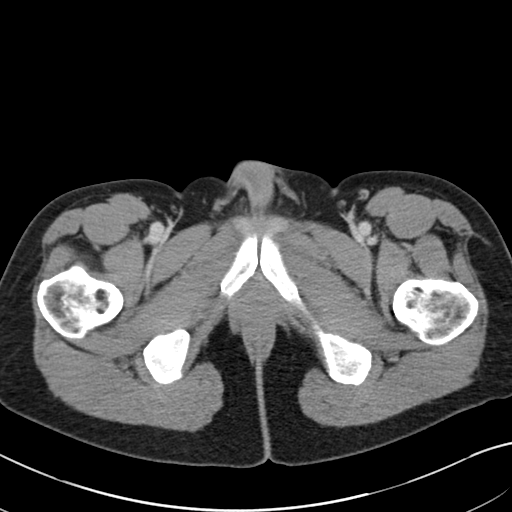
[im 5/90  bone]
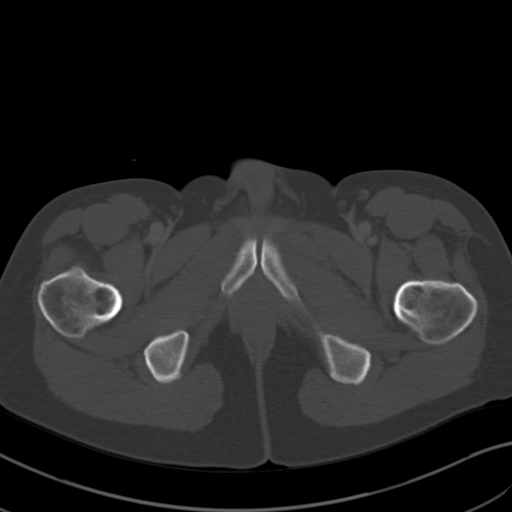
[im 10/90  soft-tissue]
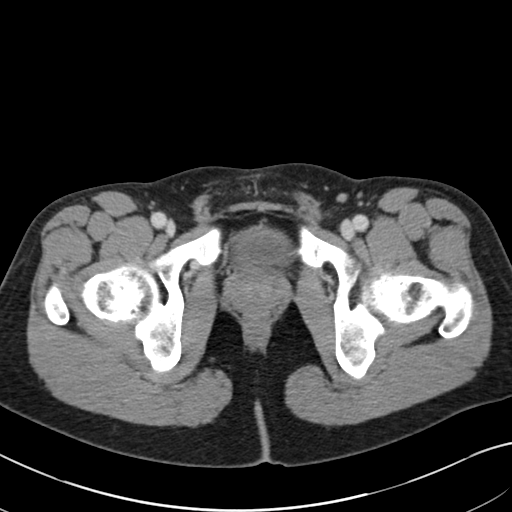
[im 20/90  soft-tissue]
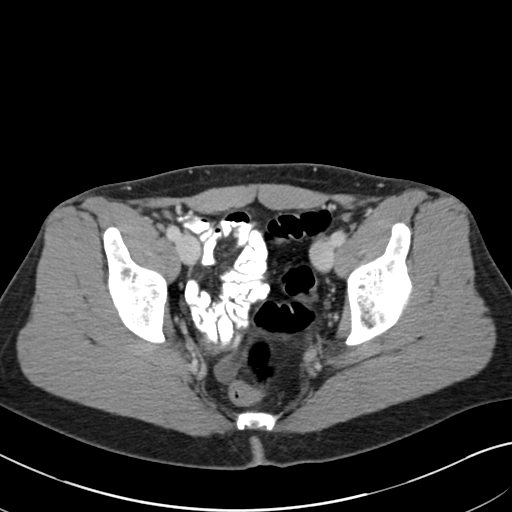
[im 25/90  soft-tissue]
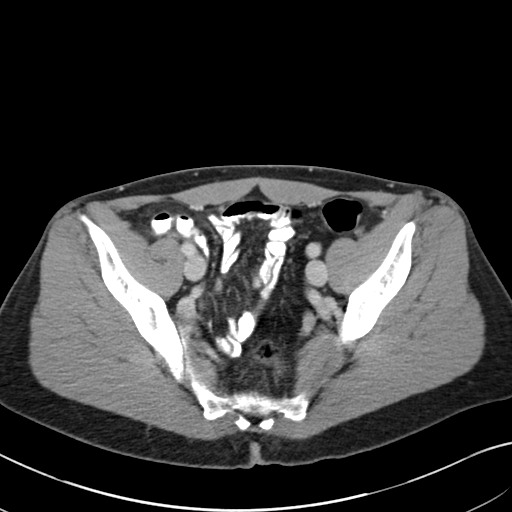
[im 30/90  soft-tissue]
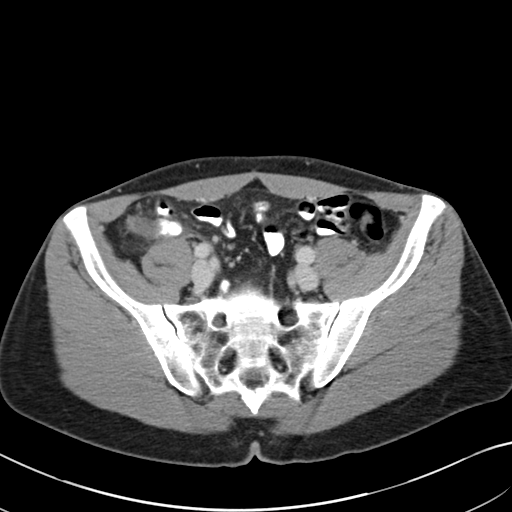
[im 40/90  soft-tissue]
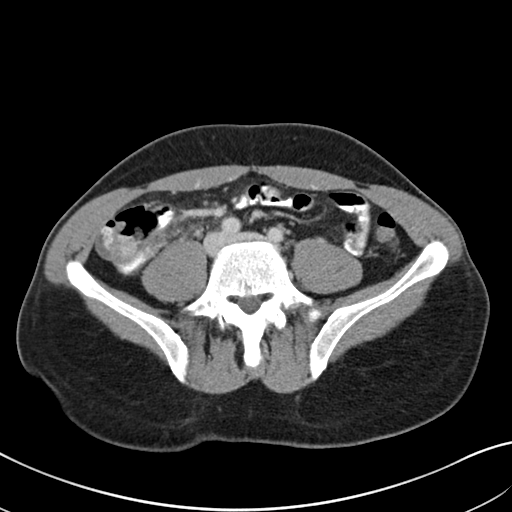
[im 45/90  soft-tissue]
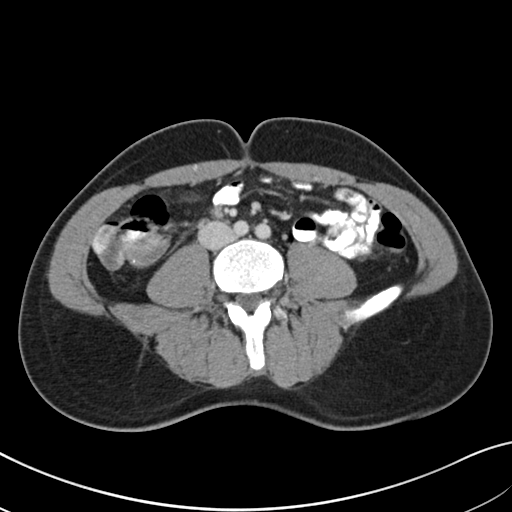
[im 50/90  soft-tissue]
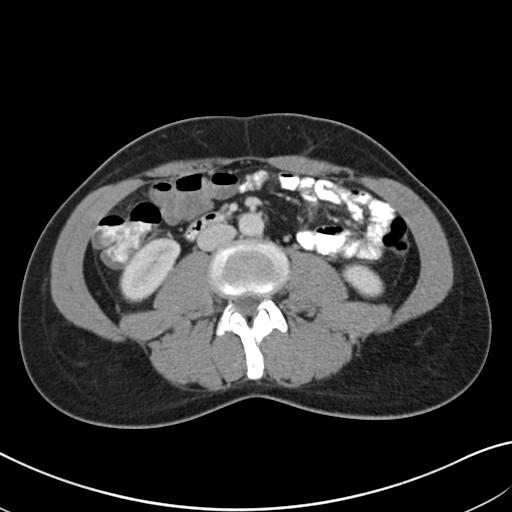
[im 60/90  soft-tissue]
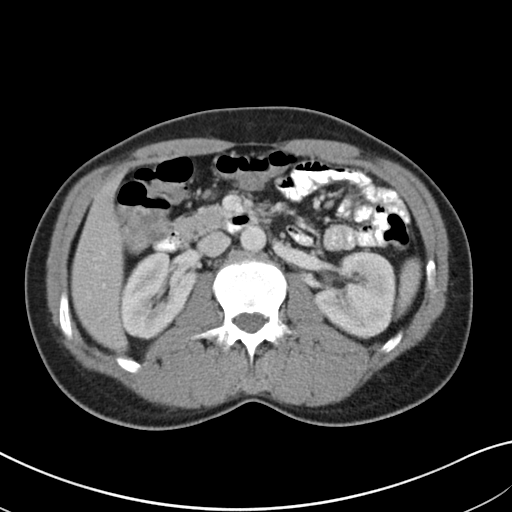
[im 60/90  bone]
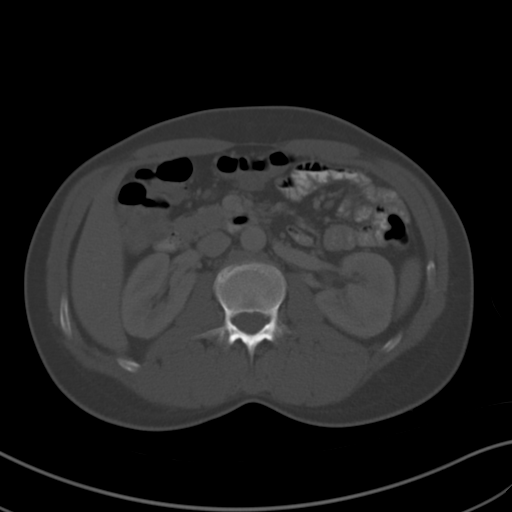
[im 65/90  soft-tissue]
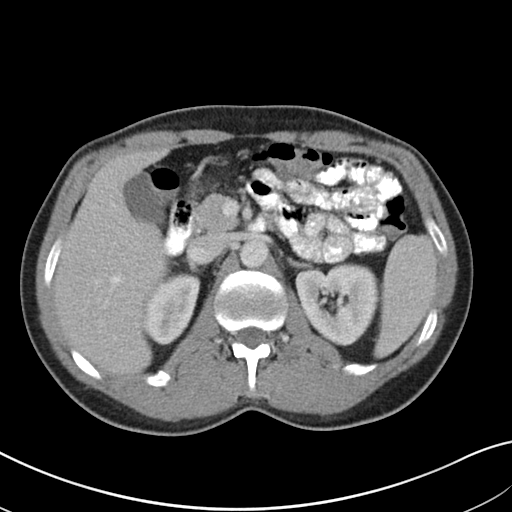
[im 70/90  soft-tissue]
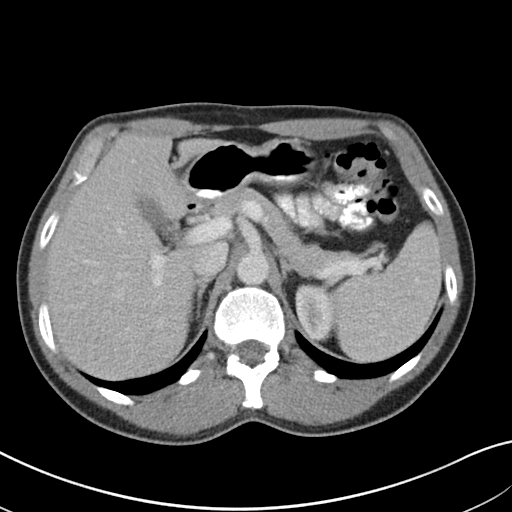
[im 80/90  soft-tissue]
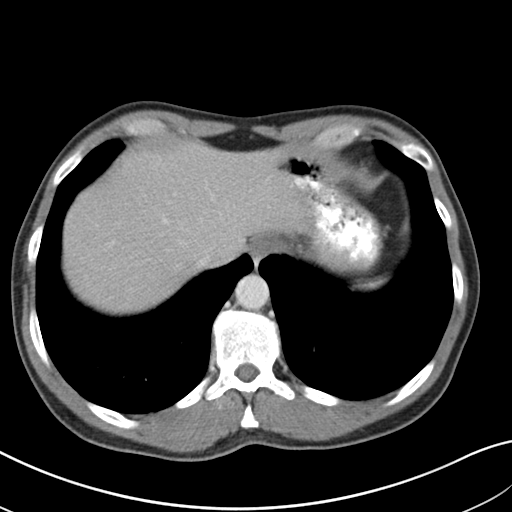
[im 85/90  soft-tissue]
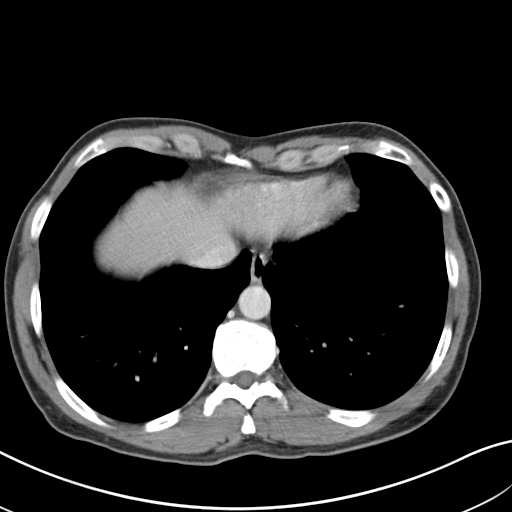

[Series 5: cor · coronal · 0.60mm/px · 3 of 137 slices shown]
[im 46/137  soft-tissue]
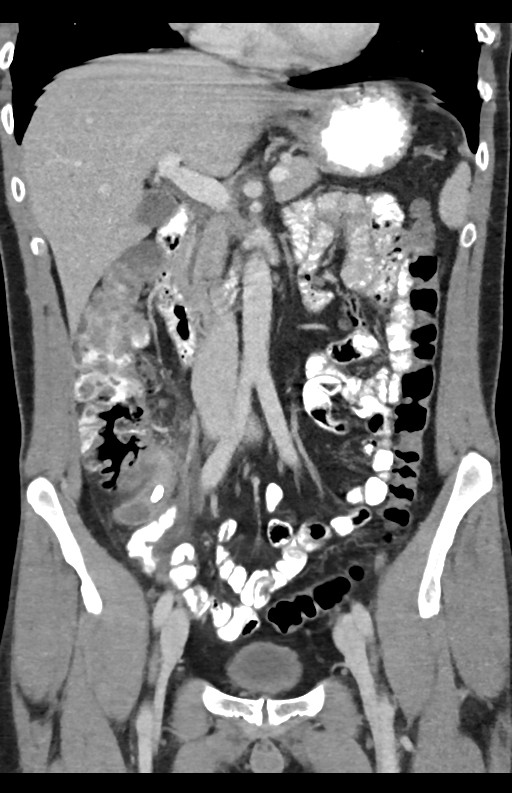
[im 61/137  soft-tissue]
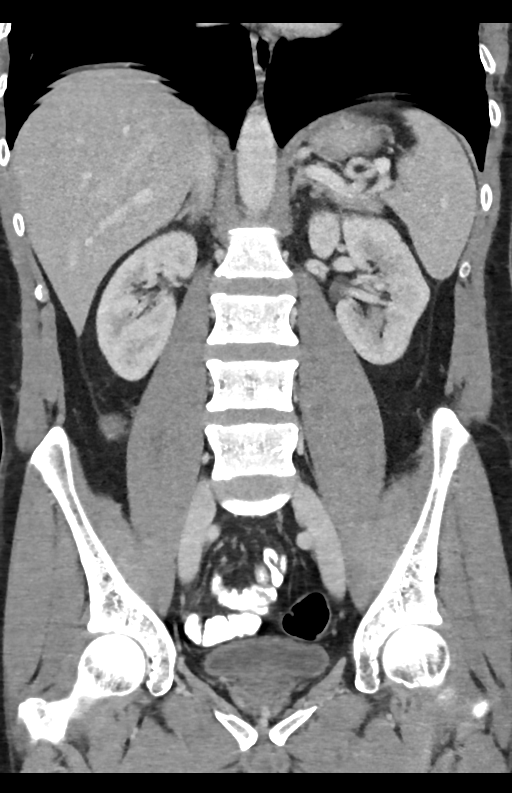
[im 76/137  soft-tissue]
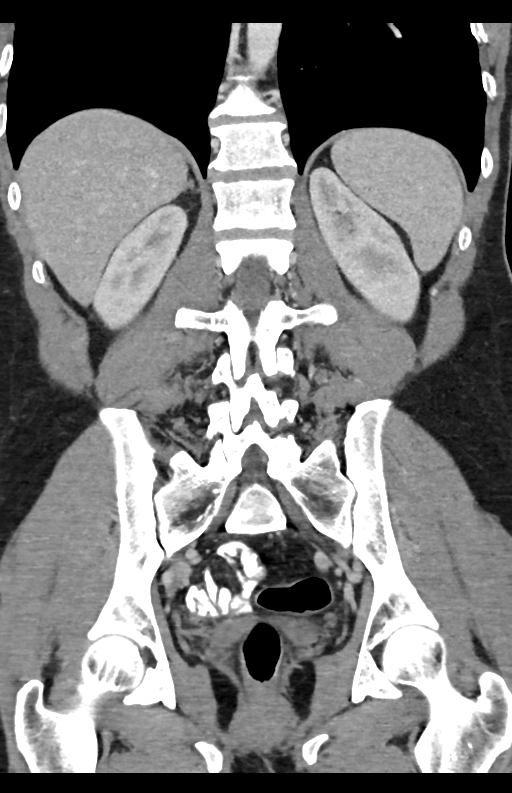

[16 of 46 positions shown; findings below may reference images not displayed]

FINDINGS: Lung bases are clear.  No pleural or pericardial fluid.

The liver has a normal appearance without focal lesions or biliary
ductal dilatation. No calcified gallstones. The spleen is normal.
The pancreas is normal. The adrenal glands are normal. The kidneys
are normal. The aorta and IVC are normal.

There is acute appendicitis with an appendicolith. The appendix is
dilated and there is periappendiceal stranding. No evidence of frank
perforation at this moment.

Bladder, prostate gland and seminal vesicles appear normal.
IMPRESSION: Acute appendicitis. Dilated appendix with appendicolith. Surrounding
soft tissue stranding. No frank perforation.

## 2018-03-17 DIAGNOSIS — N50819 Testicular pain, unspecified: Secondary | ICD-10-CM | POA: Diagnosis not present

## 2018-03-17 DIAGNOSIS — R350 Frequency of micturition: Secondary | ICD-10-CM | POA: Diagnosis not present

## 2018-04-21 DIAGNOSIS — L821 Other seborrheic keratosis: Secondary | ICD-10-CM | POA: Diagnosis not present

## 2018-04-21 DIAGNOSIS — R3915 Urgency of urination: Secondary | ICD-10-CM | POA: Diagnosis not present

## 2018-04-21 DIAGNOSIS — R351 Nocturia: Secondary | ICD-10-CM | POA: Diagnosis not present

## 2018-04-21 DIAGNOSIS — N50811 Right testicular pain: Secondary | ICD-10-CM | POA: Diagnosis not present

## 2018-04-21 DIAGNOSIS — Z86018 Personal history of other benign neoplasm: Secondary | ICD-10-CM | POA: Diagnosis not present

## 2018-04-21 DIAGNOSIS — D225 Melanocytic nevi of trunk: Secondary | ICD-10-CM | POA: Diagnosis not present

## 2018-04-21 DIAGNOSIS — D1801 Hemangioma of skin and subcutaneous tissue: Secondary | ICD-10-CM | POA: Diagnosis not present

## 2018-04-21 DIAGNOSIS — N401 Enlarged prostate with lower urinary tract symptoms: Secondary | ICD-10-CM | POA: Diagnosis not present

## 2018-07-05 DIAGNOSIS — N50811 Right testicular pain: Secondary | ICD-10-CM | POA: Diagnosis not present

## 2018-07-05 DIAGNOSIS — R351 Nocturia: Secondary | ICD-10-CM | POA: Diagnosis not present

## 2018-07-05 DIAGNOSIS — N401 Enlarged prostate with lower urinary tract symptoms: Secondary | ICD-10-CM | POA: Diagnosis not present

## 2018-07-31 DIAGNOSIS — Z13220 Encounter for screening for lipoid disorders: Secondary | ICD-10-CM | POA: Diagnosis not present

## 2018-07-31 DIAGNOSIS — Z0001 Encounter for general adult medical examination with abnormal findings: Secondary | ICD-10-CM | POA: Diagnosis not present

## 2018-08-07 DIAGNOSIS — Z0001 Encounter for general adult medical examination with abnormal findings: Secondary | ICD-10-CM | POA: Diagnosis not present

## 2019-03-14 DIAGNOSIS — W57XXXA Bitten or stung by nonvenomous insect and other nonvenomous arthropods, initial encounter: Secondary | ICD-10-CM | POA: Diagnosis not present

## 2019-03-14 DIAGNOSIS — S80869A Insect bite (nonvenomous), unspecified lower leg, initial encounter: Secondary | ICD-10-CM | POA: Diagnosis not present

## 2019-06-05 DIAGNOSIS — H10812 Pingueculitis, left eye: Secondary | ICD-10-CM | POA: Diagnosis not present

## 2019-06-05 DIAGNOSIS — H11153 Pinguecula, bilateral: Secondary | ICD-10-CM | POA: Diagnosis not present

## 2019-06-05 DIAGNOSIS — H1045 Other chronic allergic conjunctivitis: Secondary | ICD-10-CM | POA: Diagnosis not present

## 2019-07-24 DIAGNOSIS — D1801 Hemangioma of skin and subcutaneous tissue: Secondary | ICD-10-CM | POA: Diagnosis not present

## 2019-07-24 DIAGNOSIS — Z86018 Personal history of other benign neoplasm: Secondary | ICD-10-CM | POA: Diagnosis not present

## 2019-07-24 DIAGNOSIS — D485 Neoplasm of uncertain behavior of skin: Secondary | ICD-10-CM | POA: Diagnosis not present

## 2019-07-24 DIAGNOSIS — D225 Melanocytic nevi of trunk: Secondary | ICD-10-CM | POA: Diagnosis not present

## 2019-07-24 DIAGNOSIS — L821 Other seborrheic keratosis: Secondary | ICD-10-CM | POA: Diagnosis not present

## 2019-08-27 DIAGNOSIS — I10 Essential (primary) hypertension: Secondary | ICD-10-CM | POA: Diagnosis not present

## 2019-08-27 DIAGNOSIS — Z0000 Encounter for general adult medical examination without abnormal findings: Secondary | ICD-10-CM | POA: Diagnosis not present

## 2019-08-27 DIAGNOSIS — E785 Hyperlipidemia, unspecified: Secondary | ICD-10-CM | POA: Diagnosis not present

## 2019-09-04 DIAGNOSIS — Z0001 Encounter for general adult medical examination with abnormal findings: Secondary | ICD-10-CM | POA: Diagnosis not present

## 2019-09-04 DIAGNOSIS — R945 Abnormal results of liver function studies: Secondary | ICD-10-CM | POA: Diagnosis not present

## 2019-09-20 DIAGNOSIS — R945 Abnormal results of liver function studies: Secondary | ICD-10-CM | POA: Diagnosis not present

## 2021-12-17 DIAGNOSIS — Z0000 Encounter for general adult medical examination without abnormal findings: Secondary | ICD-10-CM | POA: Diagnosis not present

## 2021-12-17 DIAGNOSIS — E785 Hyperlipidemia, unspecified: Secondary | ICD-10-CM | POA: Diagnosis not present

## 2021-12-17 DIAGNOSIS — I10 Essential (primary) hypertension: Secondary | ICD-10-CM | POA: Diagnosis not present

## 2021-12-17 DIAGNOSIS — R7989 Other specified abnormal findings of blood chemistry: Secondary | ICD-10-CM | POA: Diagnosis not present

## 2021-12-17 DIAGNOSIS — R7309 Other abnormal glucose: Secondary | ICD-10-CM | POA: Diagnosis not present

## 2021-12-17 DIAGNOSIS — Z125 Encounter for screening for malignant neoplasm of prostate: Secondary | ICD-10-CM | POA: Diagnosis not present

## 2021-12-24 DIAGNOSIS — E785 Hyperlipidemia, unspecified: Secondary | ICD-10-CM | POA: Diagnosis not present

## 2021-12-24 DIAGNOSIS — Z0000 Encounter for general adult medical examination without abnormal findings: Secondary | ICD-10-CM | POA: Diagnosis not present

## 2021-12-24 DIAGNOSIS — Z125 Encounter for screening for malignant neoplasm of prostate: Secondary | ICD-10-CM | POA: Diagnosis not present

## 2021-12-24 DIAGNOSIS — R69 Illness, unspecified: Secondary | ICD-10-CM | POA: Diagnosis not present

## 2022-07-13 ENCOUNTER — Ambulatory Visit: Payer: Self-pay | Admitting: Licensed Clinical Social Worker

## 2022-07-13 NOTE — Patient Outreach (Signed)
  Care Coordination   07/13/2022 Name: Walter Torres MRN: [id number] DOB: [birth year]   Care Coordination Outreach Attempts:  An unsuccessful telephone outreach was attempted today to offer the patient information about available care coordination services as a benefit of their health plan.   Follow Up Plan:  Additional outreach attempts will be made to offer the patient care coordination information and services.   Encounter Outcome:  No Answer  Care Coordination Interventions Activated:  No   Care Coordination Interventions:  No, not indicated    Ander Gaster , MSW Social Worker [facility]  [phone]

## 2022-08-09 ENCOUNTER — Telehealth: Payer: Self-pay

## 2022-08-09 NOTE — Patient Outreach (Signed)
  Care Coordination   08/09/2022 Name: Walter Torres MRN: [id number] DOB: 1970/11/18   Care Coordination Outreach Attempts:  A second unsuccessful outreach was attempted today to offer the patient with information about available care coordination services as a benefit of their health plan.     Follow Up Plan:  Additional outreach attempts will be made to offer the patient care coordination information and services.   Encounter Outcome:  No Answer  Care Coordination Interventions Activated:  No   Care Coordination Interventions:  No, not indicated    Tomasa Rand, RN, BSN, CEN [facility] [employer] [phone]

## 2022-08-16 ENCOUNTER — Telehealth: Payer: Self-pay

## 2022-08-16 NOTE — Patient Outreach (Signed)
  Care Coordination   Initial Visit Note   08/16/2022 Name: Walter Torres MRN: [id number] DOB: 1971-05-01  Walter Torres is a 51 y.o. year old male who sees No primary care provider on file. for primary care. I spoke with  Walter Torres by phone today.  What matters to the patients health and wellness today?  Placed call to patient to offer [facility] care coordination program. Patient reports that he has never been a patient at [facility].    SDOH assessments and interventions completed:  No     Care Coordination Interventions Activated:  No  Care Coordination Interventions:  No, not indicated   Follow up plan: No further intervention required.   Encounter Outcome:  Pt. Refused   Tomasa Rand, RN, BSN, CEN [facility] [employer] [phone]

## 2022-12-01 DIAGNOSIS — L298 Other pruritus: Secondary | ICD-10-CM | POA: Diagnosis not present

## 2022-12-01 DIAGNOSIS — L820 Inflamed seborrheic keratosis: Secondary | ICD-10-CM | POA: Diagnosis not present

## 2022-12-01 DIAGNOSIS — L538 Other specified erythematous conditions: Secondary | ICD-10-CM | POA: Diagnosis not present

## 2022-12-01 DIAGNOSIS — D1801 Hemangioma of skin and subcutaneous tissue: Secondary | ICD-10-CM | POA: Diagnosis not present

## 2022-12-01 DIAGNOSIS — L814 Other melanin hyperpigmentation: Secondary | ICD-10-CM | POA: Diagnosis not present

## 2022-12-01 DIAGNOSIS — L821 Other seborrheic keratosis: Secondary | ICD-10-CM | POA: Diagnosis not present

## 2022-12-01 DIAGNOSIS — Z789 Other specified health status: Secondary | ICD-10-CM | POA: Diagnosis not present

## 2022-12-23 DIAGNOSIS — I10 Essential (primary) hypertension: Secondary | ICD-10-CM | POA: Diagnosis not present

## 2022-12-23 DIAGNOSIS — Z125 Encounter for screening for malignant neoplasm of prostate: Secondary | ICD-10-CM | POA: Diagnosis not present

## 2022-12-23 DIAGNOSIS — Z0000 Encounter for general adult medical examination without abnormal findings: Secondary | ICD-10-CM | POA: Diagnosis not present

## 2022-12-23 DIAGNOSIS — R5383 Other fatigue: Secondary | ICD-10-CM | POA: Diagnosis not present

## 2022-12-23 DIAGNOSIS — R7309 Other abnormal glucose: Secondary | ICD-10-CM | POA: Diagnosis not present

## 2022-12-23 DIAGNOSIS — F419 Anxiety disorder, unspecified: Secondary | ICD-10-CM | POA: Diagnosis not present

## 2022-12-23 DIAGNOSIS — E785 Hyperlipidemia, unspecified: Secondary | ICD-10-CM | POA: Diagnosis not present

## 2022-12-30 DIAGNOSIS — Z0000 Encounter for general adult medical examination without abnormal findings: Secondary | ICD-10-CM | POA: Diagnosis not present

## 2022-12-30 DIAGNOSIS — Z23 Encounter for immunization: Secondary | ICD-10-CM | POA: Diagnosis not present

## 2022-12-30 DIAGNOSIS — R12 Heartburn: Secondary | ICD-10-CM | POA: Diagnosis not present

## 2022-12-30 DIAGNOSIS — F419 Anxiety disorder, unspecified: Secondary | ICD-10-CM | POA: Diagnosis not present

## 2022-12-30 DIAGNOSIS — E785 Hyperlipidemia, unspecified: Secondary | ICD-10-CM | POA: Diagnosis not present

## 2022-12-30 DIAGNOSIS — K529 Noninfective gastroenteritis and colitis, unspecified: Secondary | ICD-10-CM | POA: Diagnosis not present

## 2023-03-15 DIAGNOSIS — R12 Heartburn: Secondary | ICD-10-CM | POA: Diagnosis not present

## 2023-03-15 DIAGNOSIS — Z125 Encounter for screening for malignant neoplasm of prostate: Secondary | ICD-10-CM | POA: Diagnosis not present

## 2023-03-15 DIAGNOSIS — Z23 Encounter for immunization: Secondary | ICD-10-CM | POA: Diagnosis not present

## 2023-03-15 DIAGNOSIS — E785 Hyperlipidemia, unspecified: Secondary | ICD-10-CM | POA: Diagnosis not present

## 2023-03-15 DIAGNOSIS — F419 Anxiety disorder, unspecified: Secondary | ICD-10-CM | POA: Diagnosis not present

## 2023-03-15 DIAGNOSIS — R7309 Other abnormal glucose: Secondary | ICD-10-CM | POA: Diagnosis not present

## 2023-03-15 DIAGNOSIS — K529 Noninfective gastroenteritis and colitis, unspecified: Secondary | ICD-10-CM | POA: Diagnosis not present

## 2023-10-20 DIAGNOSIS — L821 Other seborrheic keratosis: Secondary | ICD-10-CM | POA: Diagnosis not present

## 2023-12-12 DIAGNOSIS — L814 Other melanin hyperpigmentation: Secondary | ICD-10-CM | POA: Diagnosis not present

## 2023-12-12 DIAGNOSIS — D225 Melanocytic nevi of trunk: Secondary | ICD-10-CM | POA: Diagnosis not present

## 2023-12-12 DIAGNOSIS — L821 Other seborrheic keratosis: Secondary | ICD-10-CM | POA: Diagnosis not present

## 2024-01-02 DIAGNOSIS — K529 Noninfective gastroenteritis and colitis, unspecified: Secondary | ICD-10-CM | POA: Diagnosis not present

## 2024-01-02 DIAGNOSIS — R7309 Other abnormal glucose: Secondary | ICD-10-CM | POA: Diagnosis not present

## 2024-01-02 DIAGNOSIS — F419 Anxiety disorder, unspecified: Secondary | ICD-10-CM | POA: Diagnosis not present

## 2024-01-02 DIAGNOSIS — E785 Hyperlipidemia, unspecified: Secondary | ICD-10-CM | POA: Diagnosis not present

## 2024-01-02 DIAGNOSIS — Z125 Encounter for screening for malignant neoplasm of prostate: Secondary | ICD-10-CM | POA: Diagnosis not present

## 2024-01-10 DIAGNOSIS — Z0000 Encounter for general adult medical examination without abnormal findings: Secondary | ICD-10-CM | POA: Diagnosis not present

## 2024-01-10 DIAGNOSIS — E785 Hyperlipidemia, unspecified: Secondary | ICD-10-CM | POA: Diagnosis not present

## 2024-01-10 DIAGNOSIS — Z125 Encounter for screening for malignant neoplasm of prostate: Secondary | ICD-10-CM | POA: Diagnosis not present

## 2024-01-10 DIAGNOSIS — K30 Functional dyspepsia: Secondary | ICD-10-CM | POA: Diagnosis not present

## 2024-01-10 DIAGNOSIS — F419 Anxiety disorder, unspecified: Secondary | ICD-10-CM | POA: Diagnosis not present

## 2024-09-17 DIAGNOSIS — L820 Inflamed seborrheic keratosis: Secondary | ICD-10-CM | POA: Diagnosis not present

## 2024-09-17 DIAGNOSIS — L218 Other seborrheic dermatitis: Secondary | ICD-10-CM | POA: Diagnosis not present

## 2024-09-17 DIAGNOSIS — L538 Other specified erythematous conditions: Secondary | ICD-10-CM | POA: Diagnosis not present

## 2024-09-17 DIAGNOSIS — Z789 Other specified health status: Secondary | ICD-10-CM | POA: Diagnosis not present

## 2024-09-17 DIAGNOSIS — R208 Other disturbances of skin sensation: Secondary | ICD-10-CM | POA: Diagnosis not present

## 2024-10-08 DIAGNOSIS — Z789 Other specified health status: Secondary | ICD-10-CM | POA: Diagnosis not present

## 2024-10-08 DIAGNOSIS — R208 Other disturbances of skin sensation: Secondary | ICD-10-CM | POA: Diagnosis not present

## 2024-10-08 DIAGNOSIS — L538 Other specified erythematous conditions: Secondary | ICD-10-CM | POA: Diagnosis not present

## 2024-10-08 DIAGNOSIS — L820 Inflamed seborrheic keratosis: Secondary | ICD-10-CM | POA: Diagnosis not present
